# Patient Record
Sex: Female | Born: 1985 | Race: White | Hispanic: No | Marital: Single | Smoking: Current every day smoker
Health system: Southern US, Community
[De-identification: ages and names within clinical notes are randomized; demographics above are authoritative.]

## PROBLEM LIST (undated history)

## (undated) DIAGNOSIS — F419 Anxiety disorder, unspecified: Secondary | ICD-10-CM

## (undated) DIAGNOSIS — T50901A Poisoning by unspecified drugs, medicaments and biological substances, accidental (unintentional), initial encounter: Secondary | ICD-10-CM

## (undated) DIAGNOSIS — F1110 Opioid abuse, uncomplicated: Secondary | ICD-10-CM

---

## 2009-04-15 ENCOUNTER — Emergency Department (HOSPITAL_BASED_OUTPATIENT_CLINIC_OR_DEPARTMENT_OTHER): Admission: EM | Admit: 2009-04-15 | Discharge: 2009-04-15 | Payer: Self-pay | Admitting: Emergency Medicine

## 2009-04-15 ENCOUNTER — Ambulatory Visit: Payer: Self-pay | Admitting: Diagnostic Radiology

## 2009-04-27 ENCOUNTER — Emergency Department (HOSPITAL_BASED_OUTPATIENT_CLINIC_OR_DEPARTMENT_OTHER): Admission: EM | Admit: 2009-04-27 | Discharge: 2009-04-28 | Payer: Self-pay | Admitting: Emergency Medicine

## 2010-03-24 ENCOUNTER — Emergency Department (HOSPITAL_BASED_OUTPATIENT_CLINIC_OR_DEPARTMENT_OTHER)
Admission: EM | Admit: 2010-03-24 | Discharge: 2010-03-24 | Payer: Self-pay | Source: Home / Self Care | Admitting: Internal Medicine

## 2010-06-26 LAB — URINE MICROSCOPIC-ADD ON

## 2010-06-26 LAB — URINALYSIS, ROUTINE W REFLEX MICROSCOPIC
Bilirubin Urine: NEGATIVE
Glucose, UA: NEGATIVE mg/dL
Ketones, ur: NEGATIVE mg/dL
pH: 7 (ref 5.0–8.0)

## 2010-06-26 LAB — BASIC METABOLIC PANEL
BUN: 16 mg/dL (ref 6–23)
Calcium: 8.8 mg/dL (ref 8.4–10.5)
GFR calc non Af Amer: >60 mL/min (ref >60)
Glucose, Bld: 117 mg/dL — ABNORMAL HIGH (ref 70–99)
Sodium: 143 mEq/L (ref 135–145)

## 2010-06-26 LAB — DIFFERENTIAL
Basophils Absolute: 0.1 10*3/uL (ref 0.0–0.1)
Lymphocytes Relative: 20 % (ref 12–46)
Neutro Abs: 7.8 10*3/uL — ABNORMAL HIGH (ref 1.7–7.7)

## 2010-06-26 LAB — CBC
Hemoglobin: 13.3 g/dL (ref 12.0–15.0)
Platelets: 207 10*3/uL (ref 150–400)
RDW: 11.9 % (ref 11.5–15.5)

## 2010-06-27 LAB — URINALYSIS, ROUTINE W REFLEX MICROSCOPIC
Nitrite: NEGATIVE
Specific Gravity, Urine: 1.003 — ABNORMAL LOW (ref 1.005–1.030)
Urobilinogen, UA: 0.2 mg/dL (ref 0.0–1.0)

## 2010-06-27 LAB — PREGNANCY, URINE: Preg Test, Ur: NEGATIVE

## 2010-10-16 ENCOUNTER — Emergency Department (HOSPITAL_BASED_OUTPATIENT_CLINIC_OR_DEPARTMENT_OTHER)
Admission: EM | Admit: 2010-10-16 | Discharge: 2010-10-16 | Disposition: A | Discharge status: Home / Self Care | Payer: Self-pay | Attending: Emergency Medicine | Admitting: Emergency Medicine

## 2010-10-16 ENCOUNTER — Encounter: Payer: Self-pay | Admitting: *Deleted

## 2010-10-16 DIAGNOSIS — F411 Generalized anxiety disorder: Secondary | ICD-10-CM | POA: Insufficient documentation

## 2010-10-16 HISTORY — DX: Anxiety disorder, unspecified: F41.9

## 2010-10-16 MED ORDER — ACETAMINOPHEN 500 MG PO TABS
1000.0000 mg | ORAL_TABLET | Freq: Once | ORAL | Status: AC
  Administered 2010-10-16: 1000 mg via ORAL

## 2010-10-16 MED ORDER — LORAZEPAM 1 MG PO TABS
1.0000 mg | ORAL_TABLET | Freq: Once | ORAL | Status: AC
  Administered 2010-10-16: 1 mg via ORAL

## 2010-10-16 NOTE — ED Provider Notes (Signed)
History     Chief Complaint  Patient presents with  . Anxiety   Patient is a 25 y.o. female presenting with anxiety. The history is provided by the patient.  Anxiety This is a recurrent problem. The current episode started 1 to 2 hours ago. Pertinent negatives include no chest pain, no abdominal pain and no headaches. The symptoms are aggravated by stress. The symptoms are relieved by relaxation. The treatment provided mild relief.    Past Medical History  Diagnosis Date  . Anxiety     History reviewed. No pertinent past surgical history.  History reviewed. No pertinent family history.  History  Substance Use Topics  . Smoking status: Never Smoker   . Smokeless tobacco: Not on file  . Alcohol Use: Yes    OB History    Grav Para Term Preterm Abortions TAB SAB Ect Mult Living                  Review of Systems  Cardiovascular: Negative for chest pain.  Gastrointestinal: Negative for abdominal pain.  Neurological: Negative for headaches.  Psychiatric/Behavioral: Negative for suicidal ideas.  All other systems reviewed and are negative.    Physical Exam  BP 111/68  Pulse 82  Temp(Src) 98.3 F (36.8 C) (Oral)  Resp 16  SpO2 100%  Physical Exam  Constitutional: She appears well-developed and well-nourished. No distress.  HENT:  Head: Normocephalic.  Eyes: Pupils are equal, round, and reactive to light.  Cardiovascular: Normal heart sounds.   Pulmonary/Chest: Breath sounds normal.  Abdominal: Soft.  Musculoskeletal: Normal range of motion.  Neurological: She is alert.  Skin: Skin is warm and dry.  Psychiatric: She has a normal mood and affect. Her behavior is normal.    ED Course  Procedures  MDM Seen and examined, given anxiolytics -- pt was out of her Jaynie Bream but will get refill tomorrow, will be stable for dc home. Has f/u. No SI/ HI.       Sherrian Nunnelley A. Patrica Duel, MD 10/20/10 [phone]

## 2010-10-16 NOTE — ED Notes (Signed)
[id number]<AV> Expected date:<BR> Expected time:<BR> Means of arrival:<BR> Comments:<BR> 25 y/o female with anxiety attack from work, hx of same, off her meds,10 min eta

## 2010-10-16 NOTE — ED Notes (Signed)
Pt amb to room 12 with ems.  Pt is crying, reports sudden onset of anxiety attack while at work.  Has hx of same, has been off her meds, has rx she has not filled yet.

## 2010-10-16 NOTE — ED Notes (Signed)
Pt amb to room 12 with ems, reports sudden onset of anxiety attack while at work today, pt is crying but cooperative, states she has been out of her clonopin, but has a rx to fill it tomorrow.  Pt denies any si or hi, denies any other c/o.

## 2011-03-31 ENCOUNTER — Emergency Department (INDEPENDENT_AMBULATORY_CARE_PROVIDER_SITE_OTHER): Payer: Self-pay

## 2011-03-31 ENCOUNTER — Encounter (HOSPITAL_BASED_OUTPATIENT_CLINIC_OR_DEPARTMENT_OTHER): Payer: Self-pay

## 2011-03-31 ENCOUNTER — Emergency Department (HOSPITAL_BASED_OUTPATIENT_CLINIC_OR_DEPARTMENT_OTHER)
Admission: EM | Admit: 2011-03-31 | Discharge: 2011-03-31 | Disposition: A | Discharge status: Home / Self Care | Payer: Self-pay | Attending: Emergency Medicine | Admitting: Emergency Medicine

## 2011-03-31 DIAGNOSIS — M25539 Pain in unspecified wrist: Secondary | ICD-10-CM

## 2011-03-31 DIAGNOSIS — S52202A Unspecified fracture of shaft of left ulna, initial encounter for closed fracture: Secondary | ICD-10-CM

## 2011-03-31 DIAGNOSIS — S52209A Unspecified fracture of shaft of unspecified ulna, initial encounter for closed fracture: Secondary | ICD-10-CM

## 2011-03-31 DIAGNOSIS — M79609 Pain in unspecified limb: Secondary | ICD-10-CM

## 2011-03-31 DIAGNOSIS — X58XXXA Exposure to other specified factors, initial encounter: Secondary | ICD-10-CM

## 2011-03-31 MED ORDER — OXYCODONE-ACETAMINOPHEN 5-325 MG PO TABS
2.0000 | ORAL_TABLET | Freq: Once | ORAL | Status: AC
  Administered 2011-03-31: 2 via ORAL
  Filled 2011-03-31: qty 2

## 2011-03-31 MED ORDER — OXYCODONE-ACETAMINOPHEN 5-325 MG PO TABS: 1.0000 | ORAL_TABLET | ORAL | Status: AC | PRN
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
## 2012-01-07 ENCOUNTER — Encounter (HOSPITAL_BASED_OUTPATIENT_CLINIC_OR_DEPARTMENT_OTHER): Payer: Self-pay | Admitting: Emergency Medicine

## 2012-01-07 ENCOUNTER — Emergency Department (HOSPITAL_BASED_OUTPATIENT_CLINIC_OR_DEPARTMENT_OTHER)
Admission: EM | Admit: 2012-01-07 | Discharge: 2012-01-07 | Disposition: A | Discharge status: Home / Self Care | Payer: Self-pay | Attending: Emergency Medicine | Admitting: Emergency Medicine

## 2012-01-07 DIAGNOSIS — N61 Mastitis without abscess: Secondary | ICD-10-CM | POA: Insufficient documentation

## 2012-01-07 DIAGNOSIS — L0291 Cutaneous abscess, unspecified: Secondary | ICD-10-CM

## 2012-01-07 DIAGNOSIS — F411 Generalized anxiety disorder: Secondary | ICD-10-CM | POA: Insufficient documentation

## 2012-01-07 MED ORDER — SULFAMETHOXAZOLE-TRIMETHOPRIM 800-160 MG PO TABS: 1.0000 | ORAL_TABLET | Freq: Two times a day (BID) | ORAL | Status: DC

## 2012-01-07 MED ORDER — LIDOCAINE-PRILOCAINE 2.5-2.5 % EX CREA
TOPICAL_CREAM | Freq: Once | CUTANEOUS | Status: DC
  Filled 2012-01-07: qty 5

## 2012-01-07 MED ORDER — HYDROMORPHONE HCL PF 2 MG/ML IJ SOLN
2.0000 mg | Freq: Once | INTRAMUSCULAR | Status: AC
  Administered 2012-01-07: 2 mg via INTRAMUSCULAR
  Filled 2012-01-07: qty 1

## 2012-01-07 MED ORDER — HYDROMORPHONE HCL 4 MG PO TABS: 4.0000 mg | ORAL_TABLET | Freq: Four times a day (QID) | ORAL | Status: DC | PRN

## 2012-01-07 MED ORDER — LIDOCAINE-EPINEPHRINE 2 %-1:100000 IJ SOLN
INTRAMUSCULAR | Status: AC
  Filled 2012-01-07: qty 1

## 2012-01-07 NOTE — ED Provider Notes (Signed)
History     CSN: [id number]  Arrival date & time 01/07/12  1820   First MD Initiated Contact with Patient 01/07/12 2032      Chief Complaint  Patient presents with  . Breast Mass    (Consider location/radiation/quality/duration/timing/severity/associated sxs/prior treatment) HPI This 26 year old female is a cyst in her left lower chest area for the last couple years but now has become painful in the last few days with fever to 102. Some slight redness to the cyst area now in the skin. There's been no drainage. She has no dizziness no confusion no generalized rash no chest pain no shortness breath no abdominal pain no vomiting. There is no treatment prior to arrival. Her pain is severe well localized without radiation or associated symptoms other than the fever. Past Medical History  Diagnosis Date  . Anxiety     History reviewed. No pertinent past surgical history.  No family history on file.  History  Substance Use Topics  . Smoking status: Never Smoker   . Smokeless tobacco: Not on file  . Alcohol Use: No    OB History    Grav Para Term Preterm Abortions TAB SAB Ect Mult Living                  Review of Systems 10 Systems reviewed and are negative for acute change except as noted in the HPI. Allergies  Erythromycin base and Tylenol  Home Medications   Current Outpatient Rx  Name Route Sig Dispense Refill  . ADDERALL PO Oral Take by mouth 1 day or 1 dose.      Marland Kitchen HYDROMORPHONE HCL 4 MG PO TABS Oral Take 1 tablet (4 mg total) by mouth every 6 (six) hours as needed for pain. 10 tablet 0  . SULFAMETHOXAZOLE-TRIMETHOPRIM 800-160 MG PO TABS Oral Take 1 tablet by mouth 2 (two) times daily. One po bid x 7 days 14 tablet 0    BP 124/73  Pulse 92  Temp 98.5 F (36.9 C) (Oral)  Resp 15  Ht 5\' 9"  (1.753 m)  Wt 180 lb (81.647 kg)  BMI 26.58 kg/m2  SpO2 100%  Physical Exam  Nursing note and vitals reviewed. Constitutional:       Awake, alert, nontoxic appearance.   HENT:  Head: Atraumatic.  Eyes: Right eye exhibits no discharge. Left eye exhibits no discharge.  Neck: Neck supple.  Cardiovascular: Normal rate and regular rhythm.   No murmur heard. Pulmonary/Chest: Effort normal and breath sounds normal. No respiratory distress. She has no wheezes. She has no rales. She exhibits tenderness.       Left lower anterior chest has a tender 2 cm firm subcutaneous nodule not fluctuant however limited bedside emergency department old chart reveals subcutaneous fluid collection consistent with suspected abscess  Abdominal: Soft. There is no tenderness. There is no rebound.  Musculoskeletal: She exhibits no tenderness.       Baseline ROM, no obvious new focal weakness.  Neurological: She is alert.       Mental status and motor strength appears baseline for patient and situation.  Skin: No rash noted.  Psychiatric: She has a normal mood and affect.    ED Course  Procedures (including critical care time)  Labs Reviewed - No data to display No results found.   1. Abscess       MDM  Medical screening examination/treatment/procedure(s) were conducted as a shared visit with non-physician practitioner(s) and myself.  I personally evaluated the patient during the  encounter. PA performed I&D. Pt stable in ED with no significant deterioration in condition.Patient / Family / Caregiver informed of clinical course, understand medical decision-making process, and agree with plan I doubt any other EMC precluding discharge at this time including, but not necessarily limited to the following:sepsis.        Hurman Horn, MD 01/08/12 Marlyne Beards

## 2012-01-07 NOTE — ED Notes (Signed)
Lump under breast.  States has been there x 4 yrs but was small and not painful

## 2012-02-03 NOTE — ED Provider Notes (Signed)
INCISION AND DRAINAGE Performed by: Carolee Rota Consent: Verbal consent obtained. Risks and benefits: risks, benefits and alternatives were discussed Type: abscess  Body area: thorax,  inferior to left breast  Anesthesia: local infiltration  Local anesthetic: lidocaine 2% with epinephrine  Anesthetic total: 3 ml  Complexity: complex Blunt dissection to break up loculations  Drainage: purulent, sebum  Drainage amount: minimal  Patient tolerance: Patient tolerated the procedure well with no immediate complications.     [place], [place] 02/03/12 [phone]

## 2012-02-11 NOTE — ED Provider Notes (Signed)
Medical screening examination/treatment/procedure(s) were performed by non-physician practitioner and as supervising physician I was immediately available for consultation/collaboration.  Jeramyah Goodpasture M Edyn Popoca, MD 02/11/12 1632 

## 2012-09-15 ENCOUNTER — Encounter (HOSPITAL_BASED_OUTPATIENT_CLINIC_OR_DEPARTMENT_OTHER): Payer: Self-pay

## 2012-09-15 ENCOUNTER — Emergency Department (HOSPITAL_BASED_OUTPATIENT_CLINIC_OR_DEPARTMENT_OTHER)
Admission: EM | Admit: 2012-09-15 | Discharge: 2012-09-15 | Disposition: A | Discharge status: Home / Self Care | Payer: Self-pay | Attending: Emergency Medicine | Admitting: Emergency Medicine

## 2012-09-15 DIAGNOSIS — Z79899 Other long term (current) drug therapy: Secondary | ICD-10-CM | POA: Insufficient documentation

## 2012-09-15 DIAGNOSIS — L02214 Cutaneous abscess of groin: Secondary | ICD-10-CM

## 2012-09-15 DIAGNOSIS — L02219 Cutaneous abscess of trunk, unspecified: Secondary | ICD-10-CM | POA: Insufficient documentation

## 2012-09-15 DIAGNOSIS — K644 Residual hemorrhoidal skin tags: Secondary | ICD-10-CM | POA: Insufficient documentation

## 2012-09-15 DIAGNOSIS — F411 Generalized anxiety disorder: Secondary | ICD-10-CM | POA: Insufficient documentation

## 2012-09-15 MED ORDER — PRAMOXINE HCL 1 % RE FOAM: RECTAL | Status: DC | PRN

## 2012-09-15 MED ORDER — HYDROCODONE-IBUPROFEN 7.5-200 MG PO TABS: 1.0000 | ORAL_TABLET | Freq: Four times a day (QID) | ORAL | Status: DC | PRN

## 2012-09-15 MED ORDER — SULFAMETHOXAZOLE-TRIMETHOPRIM 800-160 MG PO TABS: 1.0000 | ORAL_TABLET | Freq: Two times a day (BID) | ORAL | Status: DC

## 2012-09-15 NOTE — ED Notes (Signed)
Patient here with reported abscess to left side of labia and also hemorrhoidal pain. Reports severe pain from both x 2 days.

## 2012-09-15 NOTE — ED Provider Notes (Signed)
History     CSN: [id number]  Arrival date & time 09/15/12  2116   First MD Initiated Contact with Patient 09/15/12 2141      Chief Complaint  Patient presents with  . Abscess    (Consider location/radiation/quality/duration/timing/severity/associated sxs/prior treatment) Patient is a 27 y.o. female presenting with abscess. The history is provided by the patient. No language interpreter was used.  Abscess Location:  Ano-genital Ano-genital abscess location: left pubic area. Abscess quality: painful, redness and warmth   Red streaking: no   Pain details:    Quality:  Sharp   Severity:  Moderate Chronicity:  New Ineffective treatments:  None tried Pt reports she has hemorrhoids that are bleeding as swell as skin infection  Past Medical History  Diagnosis Date  . Anxiety     History reviewed. No pertinent past surgical history.  No family history on file.  History  Substance Use Topics  . Smoking status: Never Smoker   . Smokeless tobacco: Not on file  . Alcohol Use: No    OB History   Grav Para Term Preterm Abortions TAB SAB Ect Mult Living                  Review of Systems  Skin: Positive for wound.  All other systems reviewed and are negative.    Allergies  Erythromycin base and Tylenol  Home Medications   Current Outpatient Rx  Name  Route  Sig  Dispense  Refill  . clonazePAM (KLONOPIN) 1 MG tablet   Oral   Take 1 mg by mouth 3 (three) times daily as needed for anxiety.         Marland Kitchen EXPIRED: clonazePAM (KLONOPIN WAFER) 1 MG disintegrating tablet   Oral   Take 1 mg by mouth 3 (three) times daily as needed.             BP 107/79  Pulse 111  Temp(Src) 99 F (37.2 C)  Resp 16  Wt 169 lb 14.4 oz (77.066 kg)  BMI 25.08 kg/m2  SpO2 100%  Physical Exam  Nursing note and vitals reviewed. Constitutional: She is oriented to person, place, and time. She appears well-developed and well-nourished.  HENT:  Head: Normocephalic.  Eyes: Pupils are  equal, round, and reactive to light.  Cardiovascular: Normal rate.   Pulmonary/Chest: Effort normal.  Genitourinary:  Small external hemorrhoid   Musculoskeletal: She exhibits tenderness.  1.5 cm swollen area left groin.     Neurological: She is alert and oriented to person, place, and time.  Skin: Skin is warm.    ED Course  INCISION AND DRAINAGE Date/Time: 09/15/2012 10:47 PM Performed by: Elson Areas Authorized by: Elson Areas Consent: Verbal consent obtained. Required items: required blood products, implants, devices, and special equipment available Type: abscess Body area: anogenital Anesthesia: local infiltration Scalpel size: 11 Incision type: single straight Complexity: simple Drainage amount: moderate Packing material: 1/2 in iodoform gauze Patient tolerance: Patient tolerated the procedure well with no immediate complications. Comments: 3mm incision small amount of purulent drainage   (including critical care time)  Labs Reviewed - No data to display No results found.   1. Abscess of left groin       MDM     Pt given rx for proctofoam for hemorrhoids,  Bactrim for folliculitis and abscess,  vicoprofen    Elson Areas, PA-C 09/15/12 2254

## 2012-09-18 NOTE — ED Provider Notes (Signed)
Medical screening examination/treatment/procedure(s) were performed by non-physician practitioner and as supervising physician I was immediately available for consultation/collaboration.    Omair Dettmer J. Cashae Weich, MD 09/18/12 0739 

## 2015-01-10 ENCOUNTER — Emergency Department (HOSPITAL_BASED_OUTPATIENT_CLINIC_OR_DEPARTMENT_OTHER): Payer: Self-pay

## 2015-01-10 ENCOUNTER — Encounter (HOSPITAL_BASED_OUTPATIENT_CLINIC_OR_DEPARTMENT_OTHER): Payer: Self-pay | Admitting: Emergency Medicine

## 2015-01-10 ENCOUNTER — Emergency Department (HOSPITAL_BASED_OUTPATIENT_CLINIC_OR_DEPARTMENT_OTHER)
Admission: EM | Admit: 2015-01-10 | Discharge: 2015-01-10 | Discharge status: Against Medical Advice | Payer: Self-pay | Attending: Emergency Medicine | Admitting: Emergency Medicine

## 2015-01-10 DIAGNOSIS — Z720 Tobacco use: Secondary | ICD-10-CM | POA: Insufficient documentation

## 2015-01-10 DIAGNOSIS — R0789 Other chest pain: Secondary | ICD-10-CM | POA: Insufficient documentation

## 2015-01-10 HISTORY — DX: Poisoning by unspecified drugs, medicaments and biological substances, accidental (unintentional), initial encounter: T50.901A

## 2015-01-10 NOTE — ED Notes (Signed)
Called to take pt to room, no answer
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
## 2016-06-06 ENCOUNTER — Emergency Department (HOSPITAL_BASED_OUTPATIENT_CLINIC_OR_DEPARTMENT_OTHER)
Admission: EM | Admit: 2016-06-06 | Discharge: 2016-06-06 | Disposition: A | Discharge status: Home / Self Care | Payer: Self-pay | Attending: Emergency Medicine | Admitting: Emergency Medicine

## 2016-06-06 ENCOUNTER — Emergency Department (HOSPITAL_BASED_OUTPATIENT_CLINIC_OR_DEPARTMENT_OTHER): Payer: Self-pay

## 2016-06-06 ENCOUNTER — Encounter (HOSPITAL_BASED_OUTPATIENT_CLINIC_OR_DEPARTMENT_OTHER): Payer: Self-pay | Admitting: Emergency Medicine

## 2016-06-06 DIAGNOSIS — J039 Acute tonsillitis, unspecified: Secondary | ICD-10-CM | POA: Insufficient documentation

## 2016-06-06 DIAGNOSIS — F17200 Nicotine dependence, unspecified, uncomplicated: Secondary | ICD-10-CM | POA: Insufficient documentation

## 2016-06-06 DIAGNOSIS — Z79899 Other long term (current) drug therapy: Secondary | ICD-10-CM | POA: Insufficient documentation

## 2016-06-06 DIAGNOSIS — B001 Herpesviral vesicular dermatitis: Secondary | ICD-10-CM

## 2016-06-06 HISTORY — DX: Opioid abuse, uncomplicated: F11.10

## 2016-06-06 LAB — COMPREHENSIVE METABOLIC PANEL
ALT: 47 U/L (ref 14–54)
ANION GAP: 9 (ref 5–15)
AST: 36 U/L (ref 15–41)
Albumin: 3.8 g/dL (ref 3.5–5.0)
Alkaline Phosphatase: 66 U/L (ref 38–126)
BUN: 10 mg/dL (ref 6–20)
CHLORIDE: 98 mmol/L — AB (ref 101–111)
CO2: 26 mmol/L (ref 22–32)
Calcium: 8.9 mg/dL (ref 8.9–10.3)
Creatinine, Ser: 0.65 mg/dL (ref 0.44–1.00)
GFR calc Af Amer: >60 mL/min (ref >60)
GFR calc non Af Amer: >60 mL/min (ref >60)
GLUCOSE: 121 mg/dL — AB (ref 65–99)
POTASSIUM: 3.8 mmol/L (ref 3.5–5.1)
SODIUM: 133 mmol/L — AB (ref 135–145)
Total Bilirubin: 1 mg/dL (ref 0.3–1.2)
Total Protein: 8 g/dL (ref 6.5–8.1)

## 2016-06-06 LAB — CBC WITH DIFFERENTIAL/PLATELET
Basophils Absolute: 0 10*3/uL (ref 0.0–0.1)
Basophils Relative: 0 %
Eosinophils Absolute: 0.1 10*3/uL (ref 0.0–0.7)
Eosinophils Relative: 0 %
HEMATOCRIT: 37.5 % (ref 36.0–46.0)
HEMOGLOBIN: 12.3 g/dL (ref 12.0–15.0)
LYMPHS PCT: 7 %
Lymphs Abs: 1.4 10*3/uL (ref 0.7–4.0)
MCH: 31.9 pg (ref 26.0–34.0)
MCHC: 32.8 g/dL (ref 30.0–36.0)
MCV: 97.2 fL (ref 78.0–100.0)
MONO ABS: 1.6 10*3/uL — AB (ref 0.1–1.0)
MONOS PCT: 8 %
NEUTROS ABS: 18.2 10*3/uL — AB (ref 1.7–7.7)
NEUTROS PCT: 85 %
Platelets: 227 10*3/uL (ref 150–400)
RBC: 3.86 MIL/uL — ABNORMAL LOW (ref 3.87–5.11)
RDW: 12.8 % (ref 11.5–15.5)
WBC: 21.4 10*3/uL — ABNORMAL HIGH (ref 4.0–10.5)

## 2016-06-06 LAB — RAPID STREP SCREEN (MED CTR MEBANE ONLY): STREPTOCOCCUS, GROUP A SCREEN (DIRECT): NEGATIVE

## 2016-06-06 MED ORDER — IOPAMIDOL (ISOVUE-300) INJECTION 61%
100.0000 mL | Freq: Once | INTRAVENOUS | Status: AC | PRN
  Administered 2016-06-06: 100 mL via INTRAVENOUS

## 2016-06-06 MED ORDER — CLINDAMYCIN PHOSPHATE 600 MG/50ML IV SOLN
600.0000 mg | Freq: Once | INTRAVENOUS | Status: AC
  Administered 2016-06-06: 600 mg via INTRAVENOUS
  Filled 2016-06-06: qty 50

## 2016-06-06 MED ORDER — CLINDAMYCIN HCL 150 MG PO CAPS: 300.0000 mg | ORAL_CAPSULE | Freq: Three times a day (TID) | ORAL | 0 refills | Status: AC

## 2016-06-06 MED ORDER — DEXAMETHASONE SODIUM PHOSPHATE 10 MG/ML IJ SOLN
10.0000 mg | Freq: Once | INTRAMUSCULAR | Status: AC
  Administered 2016-06-06: 10 mg via INTRAVENOUS

## 2016-06-06 MED ORDER — VALACYCLOVIR HCL 1 G PO TABS: 2000.0000 mg | ORAL_TABLET | Freq: Once | ORAL | 0 refills | Status: AC

## 2016-06-06 MED ORDER — VALACYCLOVIR HCL 500 MG PO TABS
2000.0000 mg | ORAL_TABLET | Freq: Once | ORAL | Status: AC
  Administered 2016-06-06: 2000 mg via ORAL
  Filled 2016-06-06: qty 4

## 2016-06-06 MED ORDER — DEXAMETHASONE SODIUM PHOSPHATE 10 MG/ML IJ SOLN
INTRAMUSCULAR | Status: AC
  Filled 2016-06-06: qty 1

## 2016-06-06 NOTE — ED Triage Notes (Signed)
Patient reports that she had some throat swelling at home and about 3 weeks ago. She took some left over antibiotics 3 weeks ago. She reports that she went to the [facility] due to the throat swelling today and was sent here. Patient has muffled voice and noted swelling to glands. She also reports that she is having frequent fever blisters

## 2016-06-06 NOTE — ED Provider Notes (Signed)
[facility] Provider Note   CSN: [id number] Arrival date & time: 06/06/16  1927  By signing my name below, I, Talbert NanPaul Grant, attest that this documentation has been prepared under the direction and in the presence of Melburn HakeNicole Iyanni Hepp, [place]. Electronically Signed: Talbert NanPaul Grant, Scribe. 06/06/16. 8:22 PM.    History   Chief Complaint Chief Complaint  Patient presents with  . Oral Swelling    HPI Sherry Steele is a 31 y.o. female who presents to the Emergency Department complaining of worsening sore throat, onset yesterday. Patient reports this afternoon she began having significantly worsening sore throat with associated dysphagia, fever. She reports having decreased fluid intake today due to pain with swallowing. She reports that she went to the [facility] due to the throat swelling today and was sent here for further evaluation. Patient endorses associated muffled voice and swelling to the tonsils. Denies headache, facial/neck swelling, trismus, drooling, shortness of breath, sensation of throat closing, wheezing, vomiting. Denies taking any medications prior to arrival. She also reports that she is having fever blister started earlier today, reports history of similar blisters. Requesting Valtrex for fever blister.   The history is provided by the patient. No language interpreter was used.    Past Medical History:  Diagnosis Date  . Anxiety   . Heroin abuse   . OD (overdose of drug)     There are no active problems to display for this patient.   Past Surgical History:  Procedure Laterality Date  . CESAREAN SECTION      OB History    No data available       Home Medications    Prior to Admission medications   Medication Sig Start Date End Date Taking? Authorizing Provider  zolpidem (AMBIEN) 10 MG tablet Take 10 mg by mouth at bedtime as needed for sleep.   Yes Historical Provider, MD  amphetamine-dextroamphetamine (ADDERALL) 20 MG tablet Take 20 mg by mouth  daily.    Historical Provider, MD  clindamycin (CLEOCIN) 150 MG capsule Take 2 capsules (300 mg total) by mouth 3 (three) times daily. May dispense as 150mg  capsules 06/06/16   Barrett HenleNicole Elizabeth Beadie Matsunaga, PA-C  clonazePAM (KLONOPIN WAFER) 1 MG disintegrating tablet Take 1 mg by mouth 3 (three) times daily as needed.   10/15/05 10/16/10  Historical Provider, MD  clonazePAM (KLONOPIN) 1 MG tablet Take 1 mg by mouth 3 (three) times daily as needed for anxiety.    Historical Provider, MD  FLUoxetine (PROZAC) 40 MG capsule Take 40 mg by mouth daily.    Historical Provider, MD    Family History History reviewed. No pertinent family history.  Social History Social History  Substance Use Topics  . Smoking status: Current Every Day Smoker  . Smokeless tobacco: Never Used  . Alcohol use No     Allergies   Erythromycin base and Tylenol [acetaminophen]   Review of Systems Review of Systems  Constitutional: Positive for fever.  HENT: Positive for sore throat, trouble swallowing and voice change. Negative for facial swelling.   All other systems reviewed and are negative.    Physical Exam Updated Vital Signs BP 117/76   Pulse (!) 124   Temp 99 F (37.2 C) (Oral)   Resp 16   Ht 5\' 9"  (1.753 m)   Wt 82.6 kg   SpO2 98%   BMI 26.88 kg/m   Physical Exam  Constitutional: She is oriented to person, place, and time. She appears well-developed and well-nourished. No distress.  HENT:  Head: Normocephalic and atraumatic.  Right Ear: Tympanic membrane normal.  Left Ear: Tympanic membrane normal.  Nose: Nose normal.  Mouth/Throat: Uvula is midline and mucous membranes are normal. Oropharyngeal exudate, posterior oropharyngeal edema and posterior oropharyngeal erythema present. Tonsils are 3+ on the right. Tonsils are 4+ on the left. Tonsillar exudate.  Significant swelling and exudate present to bilateral tonsils, L>R, concerning for left PTA. Uvula midline. Muffled voice present on exam. Pt  tolerating secretions, no drooling. No trismus. No facial or neck swelling.  Eyes: Conjunctivae and EOM are normal. Pupils are equal, round, and reactive to light. Right eye exhibits no discharge. Left eye exhibits no discharge. No scleral icterus.  Neck: Normal range of motion. Neck supple.  Cardiovascular: Normal rate, regular rhythm, normal heart sounds and intact distal pulses.   HR 92  Pulmonary/Chest: Effort normal and breath sounds normal. No respiratory distress. She has no wheezes. She has no rales. She exhibits no tenderness.  Abdominal: Soft. Bowel sounds are normal. She exhibits no distension and no mass. There is no tenderness. There is no rebound and no guarding.  Musculoskeletal: Normal range of motion. She exhibits no edema.  Lymphadenopathy:    She has cervical adenopathy (bilateral submandibular lymphadenopathy).  Neurological: She is alert and oriented to person, place, and time.  Skin: Skin is warm and dry. She is not diaphoretic.  Single ulcer lesion present to left lower lip with erythematous base, TTP. No swelling, erythema or drainage.   Nursing note and vitals reviewed.    ED Treatments / Results   DIAGNOSTIC STUDIES: Oxygen Saturation is 99% on room air, normal by my interpretation.    COORDINATION OF CARE: 8:09 PM Discussed treatment plan with pt at bedside and pt agreed to plan, which includes CT w/ of neck.  Labs (all labs ordered are listed, but only abnormal results are displayed) Labs Reviewed  COMPREHENSIVE METABOLIC PANEL - Abnormal; Notable for the following:       Result Value   Sodium 133 (*)    Chloride 98 (*)    Glucose, Bld 121 (*)    All other components within normal limits  CBC WITH DIFFERENTIAL/PLATELET - Abnormal; Notable for the following:    WBC 21.4 (*)    RBC 3.86 (*)    Neutro Abs 18.2 (*)    Monocytes Absolute 1.6 (*)    All other components within normal limits  RAPID STREP SCREEN (NOT AT [facility])  CULTURE, GROUP A STREP [facility])     EKG  EKG Interpretation None       Radiology Ct Soft Tissue Neck W Contrast  Result Date: 06/06/2016 CLINICAL DATA:  Throat swelling and blisters around the mouth and nose. EXAM: CT NECK WITH CONTRAST TECHNIQUE: Multidetector CT imaging of the neck was performed using the standard protocol following the bolus administration of intravenous contrast. CONTRAST:  ISOVUE-300 IOPAMIDOL (ISOVUE-300) INJECTION 61% COMPARISON:  None. FINDINGS: Pharynx and larynx: The palatine tonsils are massively enlarged and heterogeneous with multiple areas of low attenuation, but no focal abscess or drainable fluid collection. The adenoid tonsils are also enlarged. There is a small retropharyngeal effusion. The larynx and epiglottis are normal. Salivary glands: The parotid and submandibular glands are normal. No sialolithiasis or salivary ductal dilatation. Thyroid: Normal Lymph nodes: There are multiple enlarged cervical lymph nodes. Right level IIa nodes measure up to 13 mm. Left level IIa nodes measure up to 10 mm. Right level 5A nodes measure up to 10 mm. No enlarged  or abnormal density lower cervical lymph nodes. Vascular: The major cervical vessels are normal. Limited intracranial: Normal Visualized orbits: Normal Mastoids and visualized paranasal sinuses: Clear Skeleton: There is no bony spinal canal stenosis. No lytic or blastic lesions. Upper chest: Clear Other: None IMPRESSION: 1. Severely enlarged and heterogeneous palatine tonsils with areas of relatively low attenuation suggesting phlegmon and possible early stages of abscess formation. No abscess or drainable fluid collection at this time. 2. Small retropharyngeal effusion and mildly enlarged adenoid tonsils. 3. Bilateral reactive cervical lymphadenopathy. Electronically Signed   By: Deatra Robinson M.D.   On: 06/06/2016 22:02    Procedures Procedures (including critical care time)  Medications Ordered in ED Medications  dexamethasone (DECADRON)  injection 10 mg (10 mg Intravenous Given 06/06/16 2014)  iopamidol (ISOVUE-300) 61 % injection 100 mL (100 mLs Intravenous Contrast Given 06/06/16 2106)  clindamycin (CLEOCIN) IVPB 600 mg (0 mg Intravenous Stopped 06/06/16 2324)  valACYclovir (VALTREX) tablet 2,000 mg (2,000 mg Oral Given 06/06/16 2324)     Initial Impression / Assessment and Plan / ED Course  I have reviewed the triage vital signs and the nursing notes.  Pertinent labs & imaging results that were available during my care of the patient were reviewed by me and considered in my medical decision making (see chart for details).     Patient presents with worsening sore throat and fever that started yesterday. States she was initially evaluated at an urgent care prior to arrival but was sent to the ED for further evaluation. Reports decreased fluid intake due to pain with swallowing. Denies facial or neck swelling. Exam revealed significant swelling and exudate present to bilateral tonsils, L>R, concerning for left PTA. Uvula midline. Muffled voice present on exam. Pt tolerating secretions, no drooling. No trismus. No facial or neck swelling. Patient given Decadron in the ED. Due to concern for peritonsillar abscess will order a CT for further evaluation. CT showed Severely enlarged and heterogeneous palatine tonsils with areas of relatively low attenuation suggesting phlegmon and possible early stages of abscess formation, no drainable abscess present. Pt given IV clindamycin and fluids in the ED. Pt given initial dose of Valtrex in the ED. Pt tolerating PO. Plan to d/c pt home with Clindamycin and advised to call ENT office tomorrow morning to schedule follow up appointment for follow-up evaluation and further management. Patient also given prescription for second dose of Valtrex for fever blister. Discussed or return precautions with patient.  Final Clinical Impressions(s) / ED Diagnoses   Final diagnoses:  Phlegmonous tonsillitis    Fever blister    New Prescriptions Discharge Medication List as of 06/06/2016 11:47 PM    START taking these medications   Details  clindamycin (CLEOCIN) 150 MG capsule Take 2 capsules (300 mg total) by mouth 3 (three) times daily. May dispense as 150mg  capsules, Starting Tue 06/06/2016, Print    valACYclovir (VALTREX) 1000 MG tablet Take 2 tablets (2,000 mg total) by mouth once., Starting Tue 06/06/2016, Print       I personally performed the services described in this documentation, which was scribed in my presence. The recorded information has been reviewed and is accurate.     Satira Sark [place]ita, [place] 06/07/16 2956    Vanetta Mulders, MD 06/07/16 2325

## 2016-06-06 NOTE — Discharge Instructions (Signed)
Take your medications as prescribed until completed. Continue taking ibuprofen, 600mg  every 6 hours, as needed for pain relief. Continue drinking fluids at home term and hydrated. I recommend calling the ENT office scheduled below tomorrow morning to schedule a follow-up appointment for tomorrow for further management and evaluation regarding your tonsil infection due to concern for peritonsillar abscess.  Please return to the Emergency Department if symptoms worsen or new onset of fever, facial/neck swelling, unable to swallow resulting in drooling, difficulty breathing, vomiting, unable to keep fluids down.

## 2016-06-08 ENCOUNTER — Ambulatory Visit (INDEPENDENT_AMBULATORY_CARE_PROVIDER_SITE_OTHER): Payer: Self-pay | Admitting: Otolaryngology

## 2016-06-08 LAB — CULTURE, GROUP A STREP (THRC)

## 2016-06-19 ENCOUNTER — Emergency Department (HOSPITAL_BASED_OUTPATIENT_CLINIC_OR_DEPARTMENT_OTHER)
Admission: EM | Admit: 2016-06-19 | Discharge: 2016-06-19 | Disposition: A | Discharge status: Home / Self Care | Payer: Self-pay | Attending: Emergency Medicine | Admitting: Emergency Medicine

## 2016-06-19 ENCOUNTER — Encounter (HOSPITAL_BASED_OUTPATIENT_CLINIC_OR_DEPARTMENT_OTHER): Payer: Self-pay | Admitting: *Deleted

## 2016-06-19 DIAGNOSIS — F17200 Nicotine dependence, unspecified, uncomplicated: Secondary | ICD-10-CM | POA: Insufficient documentation

## 2016-06-19 DIAGNOSIS — L03111 Cellulitis of right axilla: Secondary | ICD-10-CM | POA: Insufficient documentation

## 2016-06-19 DIAGNOSIS — Z79899 Other long term (current) drug therapy: Secondary | ICD-10-CM | POA: Insufficient documentation

## 2016-06-19 DIAGNOSIS — Z23 Encounter for immunization: Secondary | ICD-10-CM | POA: Insufficient documentation

## 2016-06-19 DIAGNOSIS — L02411 Cutaneous abscess of right axilla: Secondary | ICD-10-CM | POA: Insufficient documentation

## 2016-06-19 MED ORDER — CEPHALEXIN 500 MG PO CAPS: ORAL_CAPSULE | ORAL | 0 refills | Status: AC

## 2016-06-19 MED ORDER — LIDOCAINE-EPINEPHRINE 2 %-1:100000 IJ SOLN
20.0000 mL | Freq: Once | INTRAMUSCULAR | Status: AC
  Administered 2016-06-19: 20 mL
  Filled 2016-06-19: qty 1

## 2016-06-19 MED ORDER — TETANUS-DIPHTH-ACELL PERTUSSIS 5-2.5-18.5 LF-MCG/0.5 IM SUSP
0.5000 mL | Freq: Once | INTRAMUSCULAR | Status: AC
  Administered 2016-06-19: 0.5 mL via INTRAMUSCULAR
  Filled 2016-06-19: qty 0.5

## 2016-06-19 NOTE — ED Notes (Signed)
I&D tray at bedside.

## 2016-06-19 NOTE — ED Triage Notes (Signed)
Abscess to her right axilla.  

## 2016-06-19 NOTE — Discharge Instructions (Signed)
Apply warm compress to affected area several times daily to aid with healing.  Take antibiotics as prescribed for the full duration.  Take tylenol or ibuprofen as needed for pain.  Return in 2 days for packing removal and wound recheck.

## 2016-06-19 NOTE — ED Notes (Signed)
ED Provider at bedside. 

## 2016-06-19 NOTE — ED Provider Notes (Signed)
[facility]   CSN: [id number] Arrival date & time: 06/19/16  1755   By signing my name below, I, Soijett Blue, attest that this documentation has been prepared under the direction and in the presence of Fayrene Helper, PA-C Electronically Signed: Soijett Blue, ED Scribe. 06/19/16. 6:48 PM.  History   Chief Complaint Chief Complaint  Patient presents with  . Abscess    HPI Sherry Steele is a 31 y.o. female with a PMHx of heroin abuse, OD, who presents to the Emergency Department complaining of abscess to right axilla onset 2 weeks ago. Pt reports associated redness to the affected area and right axilla pain. Pt has tried ibuprofen with no relief of her symptoms. She states that she has an abscess to her throat and is being treated with clindamycin. She describes her right axilla pain as throbbing at this time and she last shaved 2 days ago. She states that she has had an abscess underneath her left breast at her bra line. She denies fever, chills, drainage, and any other symptoms.   The history is provided by the patient. No language interpreter was used.    Past Medical History:  Diagnosis Date  . Anxiety   . Heroin abuse   . OD (overdose of drug)     There are no active problems to display for this patient.   Past Surgical History:  Procedure Laterality Date  . CESAREAN SECTION      OB History    No data available       Home Medications    Prior to Admission medications   Medication Sig Start Date End Date Taking? Authorizing Provider  amphetamine-dextroamphetamine (ADDERALL) 20 MG tablet Take 20 mg by mouth daily.   Yes Historical Provider, MD  clindamycin (CLEOCIN) 150 MG capsule Take 2 capsules (300 mg total) by mouth 3 (three) times daily. May dispense as 150mg  capsules 06/06/16  Yes Satira Sark Nadeau, PA-C  clonazePAM (KLONOPIN) 1 MG tablet Take 1 mg by mouth 3 (three) times daily as needed for anxiety.   Yes Historical Provider, MD   FLUoxetine (PROZAC) 40 MG capsule Take 40 mg by mouth daily.   Yes Historical Provider, MD  zolpidem (AMBIEN) 10 MG tablet Take 10 mg by mouth at bedtime as needed for sleep.   Yes Historical Provider, MD  clonazePAM (KLONOPIN WAFER) 1 MG disintegrating tablet Take 1 mg by mouth 3 (three) times daily as needed.   10/15/05 10/16/10  Historical Provider, MD    Family History No family history on file.  Social History Social History  Substance Use Topics  . Smoking status: Current Every Day Smoker  . Smokeless tobacco: Never Used  . Alcohol use No     Allergies   Erythromycin base and Tylenol [acetaminophen]   Review of Systems Review of Systems  Constitutional: Negative for chills and fever.  Skin: Positive for color change (redness to affected area).       +Abscess to right axilla without drainage     Physical Exam Updated Vital Signs BP 128/79   Pulse 97   Temp 98.2 F (36.8 C) (Oral)   Resp 20   Ht 5\' 9"  (1.753 m)   Wt 182 lb (82.6 kg)   LMP 06/05/2016   SpO2 100%   BMI 26.88 kg/m   Physical Exam  Constitutional: She is oriented to person, place, and time. She appears well-developed and well-nourished. No distress.  HENT:  Head: Normocephalic and atraumatic.  Eyes:  EOM are normal.  Neck: Neck supple.  Cardiovascular: Normal rate, regular rhythm and normal heart sounds.  Exam reveals no gallop and no friction rub.   No murmur heard. Pulmonary/Chest: Effort normal and breath sounds normal. No respiratory distress. She has no wheezes. She has no rales.  Abdominal: She exhibits no distension.  Musculoskeletal: Normal range of motion.  Neurological: She is alert and oriented to person, place, and time.  Skin: Skin is warm and dry.  Right axillary fold, with an area of induration and fluctuance measuring 3 cm in diameter. exquisitely TTP with surrounding cellulitic skin changes.  Psychiatric: She has a normal mood and affect. Her behavior is normal.  Nursing note  and vitals reviewed.    ED Treatments / Results  DIAGNOSTIC STUDIES: Oxygen Saturation is 100% on RA, nl by my interpretation.    COORDINATION OF CARE: 6:46 PM Discussed treatment plan with pt at bedside which includes update tetanus vaccination, I&D, and keflex Rx, and pt agreed to plan.   Procedures .Marland KitchenIncision and Drainage Date/Time: 06/19/2016 6:49 PM Performed by: Fayrene Helper Authorized by: Fayrene Helper   Consent:    Consent obtained:  Verbal   Consent given by:  Patient   Risks discussed:  Incomplete drainage, pain and infection Location:    Type:  Abscess   Size:  3 cm   Location: right axilla. Pre-procedure details:    Skin preparation:  Betadine Anesthesia (see MAR for exact dosages):    Anesthesia method:  Local infiltration   Local anesthetic:  Lidocaine 2% WITH epi (5 ml used) Procedure type:    Complexity:  Simple Procedure details:    Needle aspiration: no     Incision types:  Single straight   Incision depth:  Dermal   Scalpel blade:  11   Wound management:  Probed and deloculated   Drainage:  Purulent and bloody   Drainage amount:  Moderate   Wound treatment:  Wound left open   Packing materials:  1/4 in iodoform gauze   Amount 1/4" iodoform:  6 inches Post-procedure details:    Patient tolerance of procedure:  Tolerated well, no immediate complications    (including critical care time)  Medications Ordered in ED Medications  lidocaine-EPINEPHrine (XYLOCAINE W/EPI) 2 %-1:100000 (with pres) injection 20 mL (20 mLs Infiltration Given 06/19/16 1847)  Tdap (BOOSTRIX) injection 0.5 mL (0.5 mLs Intramuscular Given 06/19/16 1847)     Initial Impression / Assessment and Plan / ED Course  I have reviewed the triage vital signs and the nursing notes.  BP 128/79   Pulse 97   Temp 98.2 F (36.8 C) (Oral)   Resp 20   Ht 5\' 9"  (1.753 m)   Wt 82.6 kg   LMP 06/05/2016   SpO2 100%   BMI 26.88 kg/m    Patient with skin abscess. Incision and drainage  performed in the ED today.  Abscess was not large enough to warrant packing or drain placement. Wound recheck in 2 days. Supportive care and return precautions discussed.  Pt sent home with keflex Rx and advised to continue clindamycin Rx. The patient appears reasonably screened and/or stabilized for discharge and I doubt any other emergent medical condition requiring further screening, evaluation, or treatment in the ED prior to discharge.  Final Clinical Impressions(s) / ED Diagnoses   Final diagnoses:  Abscess of axilla, right  Cellulitis of axilla, right    New Prescriptions New Prescriptions   CEPHALEXIN (KEFLEX) 500 MG CAPSULE    2 caps  po bid x 7 days    I personally performed the services described in this documentation, which was scribed in my presence. The recorded information has been reviewed and is accurate.       Fayrene HelperBowie Carlethia Mesquita, PA-C 06/19/16 1935    Nira ConnPedro Eduardo Cardama, MD 06/20/16 0120

## 2022-07-02 ENCOUNTER — Emergency Department (HOSPITAL_BASED_OUTPATIENT_CLINIC_OR_DEPARTMENT_OTHER): Payer: Medicaid Other

## 2022-07-02 ENCOUNTER — Emergency Department (HOSPITAL_BASED_OUTPATIENT_CLINIC_OR_DEPARTMENT_OTHER)
Admission: EM | Admit: 2022-07-02 | Discharge: 2022-07-02 | Disposition: A | Discharge status: Home / Self Care | Payer: Medicaid Other | Attending: Emergency Medicine | Admitting: Emergency Medicine

## 2022-07-02 ENCOUNTER — Encounter (HOSPITAL_BASED_OUTPATIENT_CLINIC_OR_DEPARTMENT_OTHER): Payer: Self-pay | Admitting: Urology

## 2022-07-02 ENCOUNTER — Other Ambulatory Visit: Payer: Self-pay

## 2022-07-02 DIAGNOSIS — R0781 Pleurodynia: Secondary | ICD-10-CM

## 2022-07-02 DIAGNOSIS — J90 Pleural effusion, not elsewhere classified: Secondary | ICD-10-CM

## 2022-07-02 DIAGNOSIS — R748 Abnormal levels of other serum enzymes: Secondary | ICD-10-CM | POA: Diagnosis not present

## 2022-07-02 DIAGNOSIS — R0602 Shortness of breath: Secondary | ICD-10-CM

## 2022-07-02 DIAGNOSIS — R0789 Other chest pain: Secondary | ICD-10-CM | POA: Diagnosis present

## 2022-07-02 LAB — HEPATIC FUNCTION PANEL
ALT: 49 U/L — ABNORMAL HIGH (ref 0–44)
AST: 45 U/L — ABNORMAL HIGH (ref 15–41)
Albumin: 3.9 g/dL (ref 3.5–5.0)
Alkaline Phosphatase: 56 U/L (ref 38–126)
Bilirubin, Direct: 0.2 mg/dL (ref 0.0–0.2)
Indirect Bilirubin: 0.3 mg/dL (ref 0.3–0.9)
Total Bilirubin: 0.5 mg/dL (ref 0.3–1.2)
Total Protein: 7.3 g/dL (ref 6.5–8.1)

## 2022-07-02 LAB — CBC
HCT: 39.1 % (ref 36.0–46.0)
Hemoglobin: 12.9 g/dL (ref 12.0–15.0)
MCH: 31.7 pg (ref 26.0–34.0)
MCHC: 33 g/dL (ref 30.0–36.0)
MCV: 96.1 fL (ref 80.0–100.0)
Platelets: 185 10*3/uL (ref 150–400)
RBC: 4.07 MIL/uL (ref 3.87–5.11)
RDW: 12.4 % (ref 11.5–15.5)
WBC: 6.8 10*3/uL (ref 4.0–10.5)
nRBC: 0 % (ref 0.0–0.2)

## 2022-07-02 LAB — BASIC METABOLIC PANEL
Anion gap: 6 (ref 5–15)
BUN: 16 mg/dL (ref 6–20)
CO2: 29 mmol/L (ref 22–32)
Calcium: 9 mg/dL (ref 8.9–10.3)
Chloride: 103 mmol/L (ref 98–111)
Creatinine, Ser: 0.68 mg/dL (ref 0.44–1.00)
GFR, Estimated: >60 mL/min (ref >60)
Glucose, Bld: 76 mg/dL (ref 70–99)
Potassium: 3.9 mmol/L (ref 3.5–5.1)
Sodium: 138 mmol/L (ref 135–145)

## 2022-07-02 LAB — HCG, SERUM, QUALITATIVE: Preg, Serum: NEGATIVE

## 2022-07-02 LAB — LIPASE, BLOOD: Lipase: 24 U/L (ref 11–51)

## 2022-07-02 LAB — TROPONIN I (HIGH SENSITIVITY): Troponin I (High Sensitivity): <2 ng/L (ref <18)

## 2022-07-02 MED ORDER — IOHEXOL 350 MG/ML SOLN
75.0000 mL | Freq: Once | INTRAVENOUS | Status: AC | PRN
  Administered 2022-07-02: 75 mL via INTRAVENOUS

## 2022-07-02 NOTE — ED Provider Notes (Signed)
Emergency Department Provider Note   I have reviewed the triage vital signs and the nursing notes.   HISTORY  Chief Complaint Chest Pain   HPI Sherry Steele is a 37 y.o. female with remote history of heroin abuse, currently on methadone presents to the emergency department with acute onset sharp central chest pain.  She has some subjective dyspnea.  No leg pain or swelling.  Pain is just left of center.  Denies fall or other injury.  No fevers.  No sick contacts.  No similar pain to this in the past.  She been taking ibuprofen at home with no relief.   Past Medical History:  Diagnosis Date   Anxiety    Heroin abuse ([place])    OD (overdose of drug)     Review of Systems  Constitutional: No fever/chills Cardiovascular: Positive chest pain. Respiratory: Positive shortness of breath. Gastrointestinal: No abdominal pain.  No nausea, no vomiting.  Genitourinary: Negative for dysuria. Musculoskeletal: Negative for back pain. Skin: Negative for rash. Neurological: Negative for headaches.   ____________________________________________   PHYSICAL EXAM:  VITAL SIGNS: ED Triage Vitals  Enc Vitals Group     BP 07/02/22 1144 (!) 139/90     Pulse Rate 07/02/22 1144 (!) 102     Resp 07/02/22 1144 18     Temp 07/02/22 1144 98.7 F (37.1 C)     Temp src --      SpO2 07/02/22 1144 99 %     Weight 07/02/22 1142 187 lb (84.8 kg)     Height 07/02/22 1142 5\' 9"  (1.753 m)   Constitutional: Alert and oriented. Well appearing and in no acute distress. Eyes: Conjunctivae are normal.  Head: Atraumatic. Nose: No congestion/rhinnorhea. Mouth/Throat: Mucous membranes are moist.  Neck: No stridor.   Cardiovascular: Normal rate, regular rhythm. Good peripheral circulation. Grossly normal heart sounds.   Respiratory: Normal respiratory effort.  No retractions. Lungs CTAB. Gastrointestinal: Soft and nontender. No distention.  Musculoskeletal: No lower extremity tenderness nor edema. No  gross deformities of extremities. Neurologic:  Normal speech and language. No gross focal neurologic deficits are appreciated.  Skin:  Skin is warm, dry and intact. No rash noted.  ____________________________________________   LABS (all labs ordered are listed, but only abnormal results are displayed)  Labs Reviewed  HEPATIC FUNCTION PANEL - Abnormal; Notable for the following components:      Result Value   AST 45 (*)    ALT 49 (*)    All other components within normal limits  BASIC METABOLIC PANEL  CBC  LIPASE, BLOOD  HCG, SERUM, QUALITATIVE  TROPONIN I (HIGH SENSITIVITY)   ____________________________________________  EKG   EKG Interpretation  Date/Time:  Sunday July 02 2022 11:49:40 EDT Ventricular Rate:  94 PR Interval:  139 QRS Duration: 85 QT Interval:  363 QTC Calculation: 454 R Axis:   65 Text Interpretation: Sinus rhythm Abnormal R-wave progression, early transition Confirmed by Nanda Quinton [phone]) on 07/02/2022 12:03:23 PM        ____________________________________________  RADIOLOGY  CT Angio Chest PE W and/or Wo Contrast  Result Date: 07/02/2022 CLINICAL DATA:  Constant sharp chest pain for 2 days, shortness of breath, high clinical suspicion of pulmonary embolism EXAM: CT ANGIOGRAPHY CHEST WITH CONTRAST TECHNIQUE: Multidetector CT imaging of the chest was performed using the standard protocol during bolus administration of intravenous contrast. Multiplanar CT image reconstructions and MIPs were obtained to evaluate the vascular anatomy. RADIATION DOSE REDUCTION: This exam was performed according to the departmental  dose-optimization program which includes automated exposure control, adjustment of the mA and/or kV according to patient size and/or use of iterative reconstruction technique. CONTRAST:  34mL OMNIPAQUE IOHEXOL 350 MG/ML SOLN IV COMPARISON:  None Available. FINDINGS: Cardiovascular: Aorta normal caliber without aneurysm or dissection. Trace  pericardial effusion. Heart size normal. Pulmonary arteries adequately opacified and patent. No evidence of pulmonary embolism. Mediastinum/Nodes: Base of cervical region normal appearance. Esophagus unremarkable. No adenopathy. Lungs/Pleura: Trace LEFT pleural effusion with minimal adjacent compressive atelectasis LEFT lower lobe. Remaining lungs clear. No pulmonary infiltrate or pneumothorax. Upper Abdomen: Visualized upper abdomen unremarkable. Musculoskeletal: Osseous structures unremarkable. Review of the MIP images confirms the above findings. IMPRESSION: No evidence of pulmonary embolism. Trace LEFT pleural effusion with minimal adjacent compressive atelectasis LEFT lower lobe. Trace pericardial effusion. Electronically Signed   By: Lavonia Dana M.D.   On: 07/02/2022 13:26   DG Chest 2 View  Result Date: 07/02/2022 CLINICAL DATA:  37 year old female with chest pain for 2 days. History of IVDA. EXAM: CHEST - 2 VIEW COMPARISON:  Chest radiographs 01/10/2015. FINDINGS: Lower lung volumes today especially on the PA view. Mediastinal contours remain normal. Visualized tracheal air column is within normal limits. No pneumothorax, pulmonary edema, or consolidation. No definite pleural effusion when allowing for lower lung volumes. No confluent pulmonary opacity. Stable chronic left clavicle fracture. No acute osseous abnormality identified. Negative visible bowel gas. IMPRESSION: Lower lung volumes. No acute cardiopulmonary abnormality. Electronically Signed   By: Genevie Ann M.D.   On: 07/02/2022 11:58    ____________________________________________   PROCEDURES  Procedure(s) performed:   Procedures  None  ____________________________________________   INITIAL IMPRESSION / ASSESSMENT AND PLAN / ED COURSE  Pertinent labs & imaging results that were available during my care of the patient were reviewed by me and considered in my medical decision making (see chart for details).   This patient is  Presenting for Evaluation of CP, which does require a range of treatment options, and is a complaint that involves a high risk of morbidity and mortality.  The Differential Diagnoses includes but is not exclusive to acute coronary syndrome, aortic dissection, pulmonary embolism, cardiac tamponade, community-acquired pneumonia, pericarditis, musculoskeletal chest wall pain, etc.   Critical Interventions-    Medications  iohexol (OMNIPAQUE) 350 MG/ML injection 75 mL (75 mLs Intravenous Contrast Given 07/02/22 1305)    Reassessment after intervention:  symptoms improved.     Clinical Laboratory Tests Ordered, included troponin negative.  LFTs mildly elevated with normal bilirubin and lipase.  CBC without leukocytosis.  Radiologic Tests Ordered, included CXR and CTA PE study. I independently interpreted the images and agree with radiology interpretation.   Cardiac Monitor Tracing which shows NSR.    Social Determinants of Health Risk remove history of heroine abuse.    Medical Decision Making: Summary:  Patient presents emergency apartment with chest pain and shortness of breath.  Differential as above.  Plan for screening blood work, troponin, reassess.  Reevaluation with update and discussion with labs are reassuring.  CTA without PE.  Trace pleural effusion on the left with trace pericardial effusion.  No tamponade physiology.  Suspect some degree of pleurisy related to this fluid. Advised close PCP follow up for repeat evaluation and consideration of repeat CXR +/- ECHO.   Considered admission but workup reassuring.  Stable for outpatient follow-up.  Patient's presentation is most consistent with acute presentation with potential threat to life or bodily function.   Disposition: discharge  ____________________________________________  FINAL CLINICAL IMPRESSION(S) /  ED DIAGNOSES  Final diagnoses:  Pleuritic chest pain  SOB (shortness of breath)  Pleural effusion on left     Note:  This document was prepared using Dragon voice recognition software and may include unintentional dictation errors.  Nanda Quinton, MD, [facility]    Malayjah Otoole, [facility], MD 07/03/22 [phone]

## 2022-07-02 NOTE — ED Triage Notes (Signed)
Central chest pain x 2 days , pain sharp and constant Reports mild SOB, denies any N/V   No cardiac history, history of heroin use, on methadone at this time

## 2022-07-02 NOTE — Discharge Instructions (Signed)
You were seen in the emergency room today with chest pain.  You do not have a blood clot in your lungs.  There is a small amount of fluid at the base of the left lung which could be causing some irritation and causing your pain.  This will likely resolve with time but needs close follow-up with your primary care physician.  I list the name of primary care doctor to call for an appointment if you do not already have one.  You may continue taking ibuprofen/Tylenol as needed for pain.  Return with any new or suddenly worsening symptoms.
